# Patient Record
Sex: Female | Born: 1945 | Race: Black or African American | Hispanic: No | Marital: Single | Smoking: Never smoker
Health system: Southern US, Community
[De-identification: ages and names within clinical notes are randomized; demographics above are authoritative.]

---

## 2020-07-07 ENCOUNTER — Emergency Department (HOSPITAL_COMMUNITY)
Admission: EM | Admit: 2020-07-07 | Discharge: 2020-07-07 | Disposition: A | Discharge status: Home / Self Care | Payer: Medicare PPO | Attending: Emergency Medicine | Admitting: Emergency Medicine

## 2020-07-07 ENCOUNTER — Emergency Department (HOSPITAL_COMMUNITY): Payer: Medicare PPO

## 2020-07-07 ENCOUNTER — Encounter (HOSPITAL_COMMUNITY): Payer: Self-pay

## 2020-07-07 DIAGNOSIS — S3992XA Unspecified injury of lower back, initial encounter: Secondary | ICD-10-CM | POA: Diagnosis present

## 2020-07-07 DIAGNOSIS — M7918 Myalgia, other site: Secondary | ICD-10-CM

## 2020-07-07 DIAGNOSIS — S39012A Strain of muscle, fascia and tendon of lower back, initial encounter: Secondary | ICD-10-CM | POA: Diagnosis not present

## 2020-07-07 DIAGNOSIS — M47816 Spondylosis without myelopathy or radiculopathy, lumbar region: Secondary | ICD-10-CM | POA: Insufficient documentation

## 2020-07-07 DIAGNOSIS — Y92410 Unspecified street and highway as the place of occurrence of the external cause: Secondary | ICD-10-CM | POA: Insufficient documentation

## 2020-07-07 DIAGNOSIS — M791 Myalgia, unspecified site: Secondary | ICD-10-CM | POA: Insufficient documentation

## 2020-07-07 MED ORDER — LIDOCAINE 5 % EX PTCH: 1.0000 | MEDICATED_PATCH | CUTANEOUS | 0 refills | Status: AC

## 2020-07-07 MED ORDER — ACETAMINOPHEN 325 MG PO TABS
650.0000 mg | ORAL_TABLET | Freq: Once | ORAL | Status: AC
  Administered 2020-07-07: 650 mg via ORAL
  Filled 2020-07-07: qty 2

## 2020-07-07 MED ORDER — DICLOFENAC SODIUM 1 % EX GEL: 2.0000 g | Freq: Four times a day (QID) | CUTANEOUS | 0 refills | Status: AC
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
## 2020-07-07 NOTE — Discharge Instructions (Signed)
Home to rest, take Tylenol as needed as directed. Apply Lidoderm patch as needed as prescribed.  Can also apply diclofenac gel topically as prescribed. Recheck with your doctor if not improving in 3-5 days. Return to ER for severe or concerning symptoms.
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
